# Patient Record
Sex: Female | Born: 1965 | Race: White | Hispanic: No | Marital: Married
Health system: Southern US, Community
[De-identification: ages and names within clinical notes are randomized; demographics above are authoritative.]

---

## 2003-07-13 ENCOUNTER — Emergency Department (HOSPITAL_COMMUNITY): Admission: EM | Admit: 2003-07-13 | Discharge: 2003-07-13 | Payer: Self-pay | Admitting: Emergency Medicine

## 2004-08-20 IMAGING — CT CT PELVIS W/O CM
1 of 2 series · 15 of 32 positions shown, 19 images · non-contrast
Comparison: none

CLINICAL DATA: right flank pain; question stone
 CT OF THE ABDOMEN WITHOUT CONTRAST
 Multidetector helical study performed without IV and without oral contrast with the renal stone protocol.  There is moderate right hydronephrosis and there are mild right perinephric edematous changes.  There is a 2mm in size lower pole right renal calculus.  There is a tiny (1mm) upper pole left renal calculus.  There is no left hydronephrosis.  There is a small hiatal hernia.  The remainder of the CT of the abdomen is negative.  
 IMPRESSION
 2mm in size lower pole right renal calculus and moderate right hydronephrosis.
 Tiny (1mm) upper pole left renal calculus.  Please see CT pelvis report.  
 CT OF THE PELVIS WITHOUT CONTRAST
 There is a 1mm in size calculus seen in the region of the right ureterovesical junction producing the moderate right hydronephrosis.  There are several calcifications seen within the pelvis felt to represent phleboliths.  In addition there is a calcification noted on image sequence # 77 which I believe projects just posterior to the right ureter and most likely is within the right iliac artery.  It is however difficult on this unenhanced study to be certain that this is not an excentrically located right ureteral calculus.  There is no pelvic mass or adenopathy.
 Bilateral small renal calculi. 
 Moderate right hydronephrosis secondary to a 1mm in size stone located in the region of the right ureterovesical junction.  
 Small (2mm) calcification which most likely is associated with the right iliac artery but is in close proximity of the right ureter (please see above discussion).  
 Small hiatal hernia.

[Series 2: urogram 5.0 b30f · axial · 0.65mm/px · z∈[-444,-48]mm · 15 of 109 slices shown, 19 images]
[im 5/109  soft-tissue]
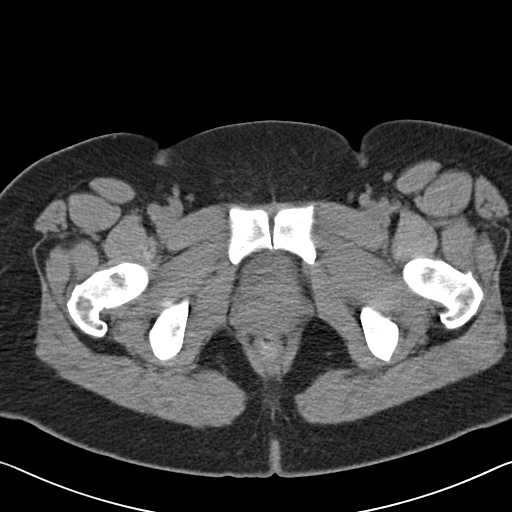
[im 5/109  bone]
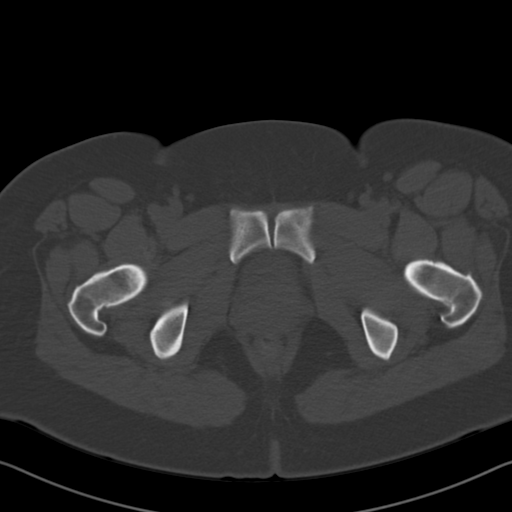
[im 14/109  soft-tissue]
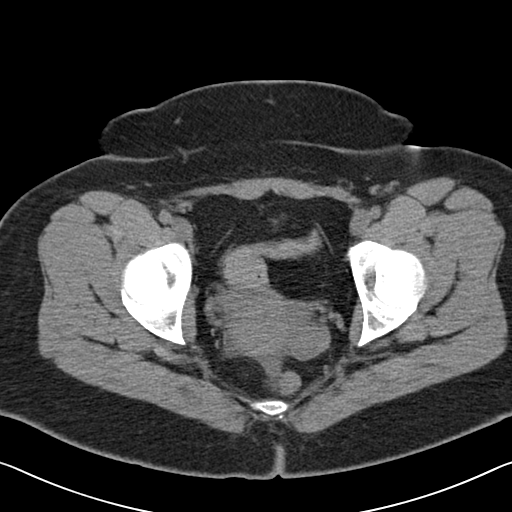
[im 23/109  soft-tissue]
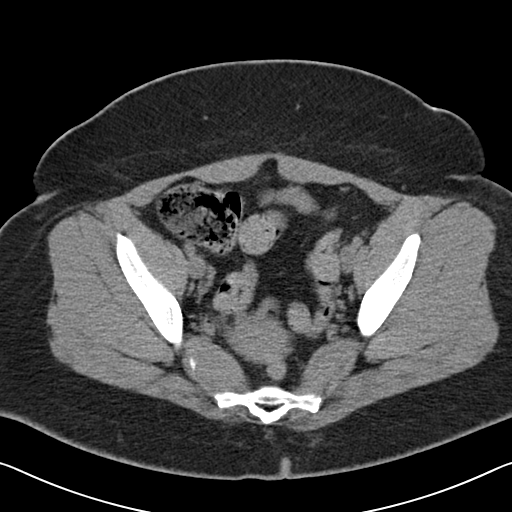
[im 32/109  soft-tissue]
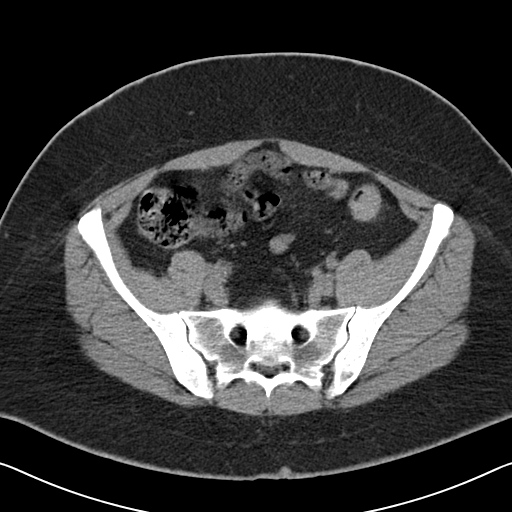
[im 37/109  soft-tissue]
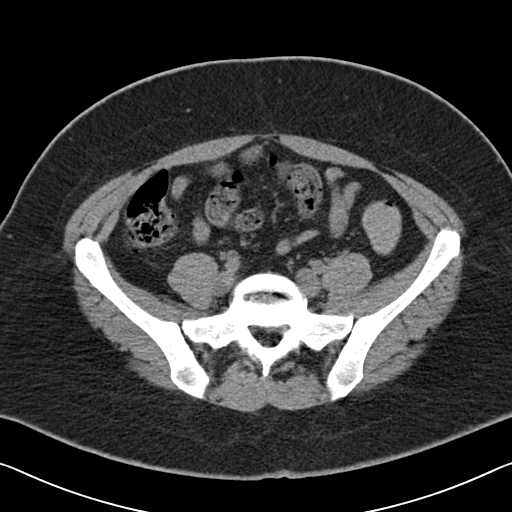
[im 46/109  soft-tissue]
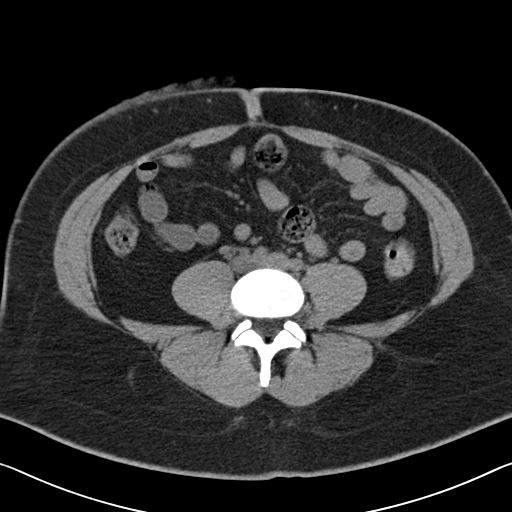
[im 55/109  soft-tissue]
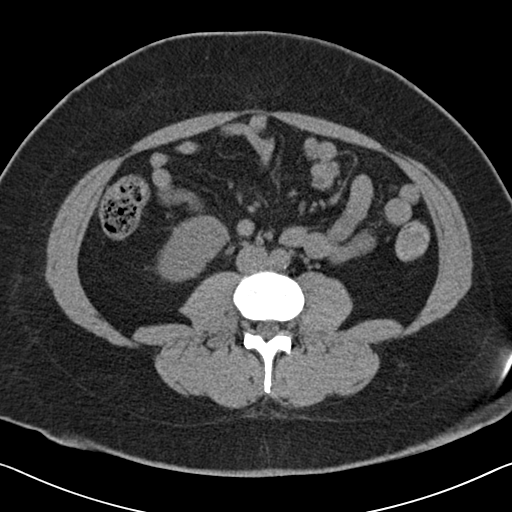
[im 64/109  soft-tissue]
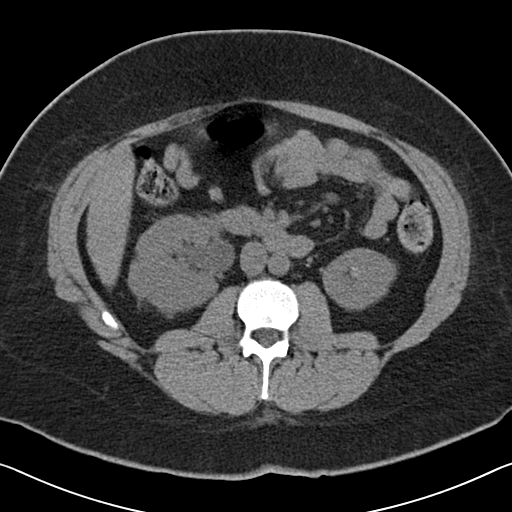
[im 73/109  soft-tissue]
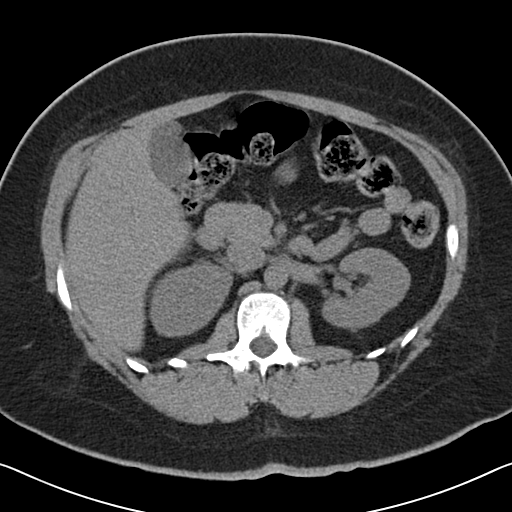
[im 73/109  bone]
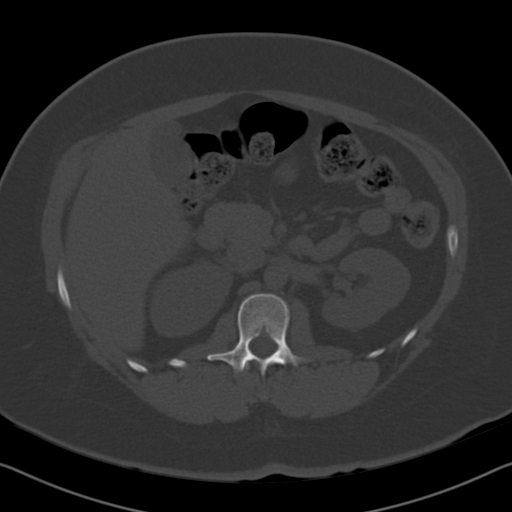
[im 77/109  soft-tissue]
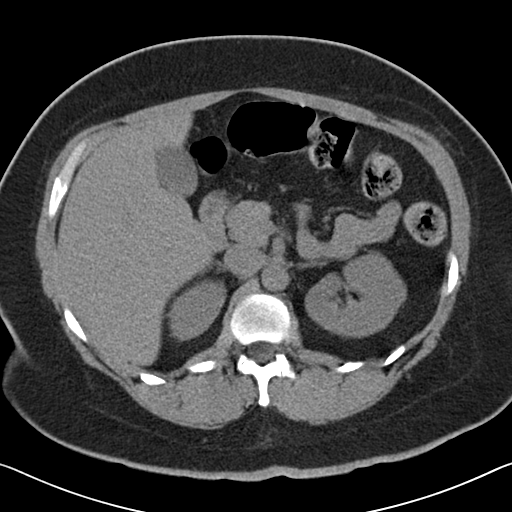
[im 86/109  soft-tissue]
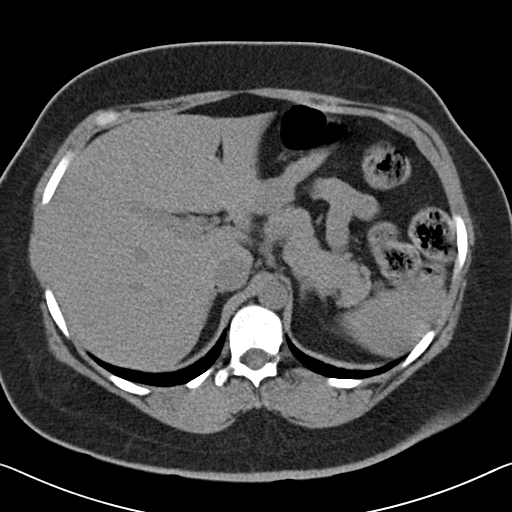
[im 91/109  lung]
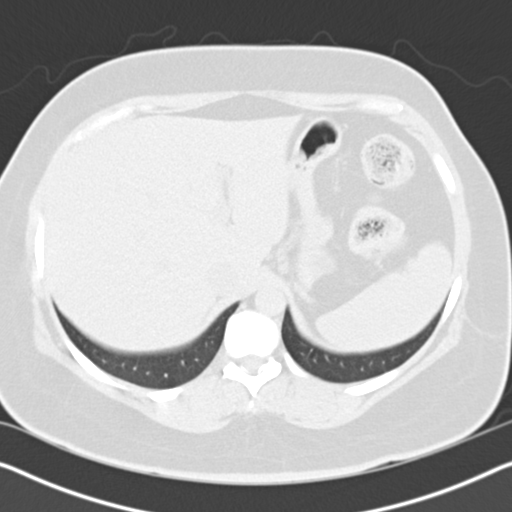
[im 95/109  soft-tissue]
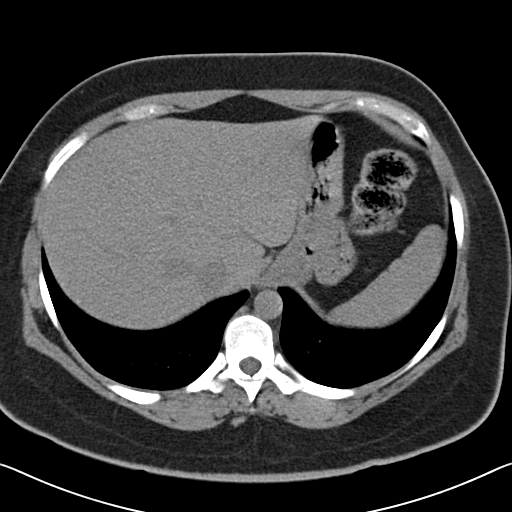
[im 95/109  lung]
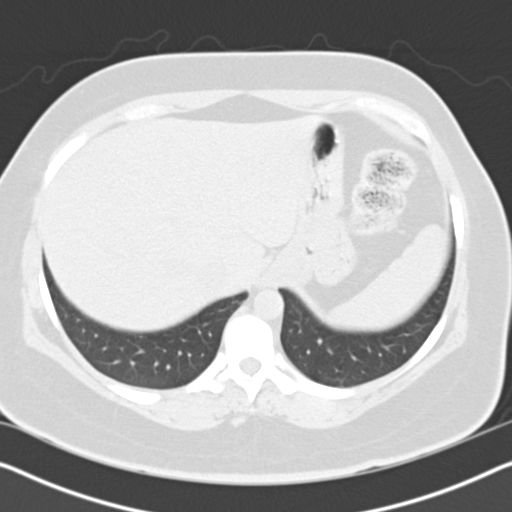
[im 100/109  lung]
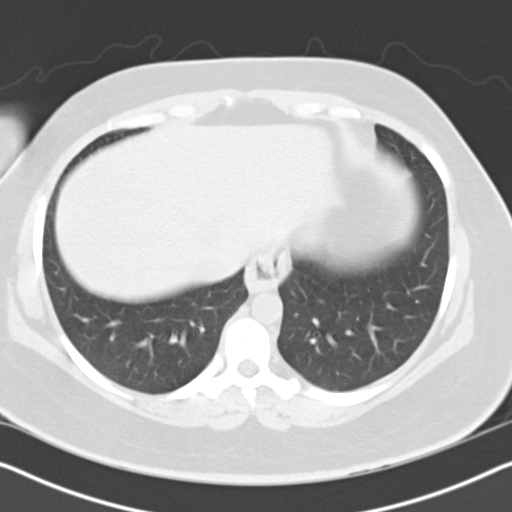
[im 104/109  soft-tissue]
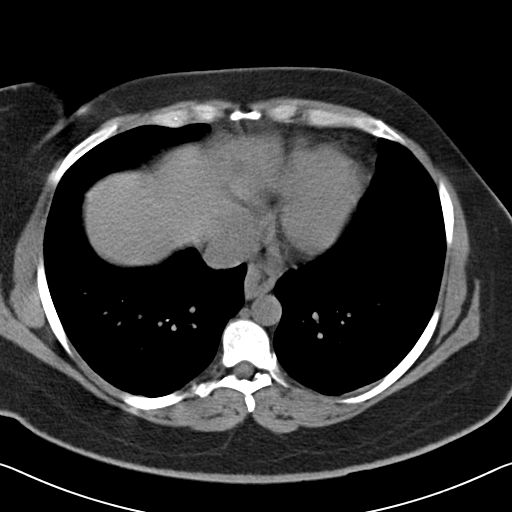
[im 104/109  lung]
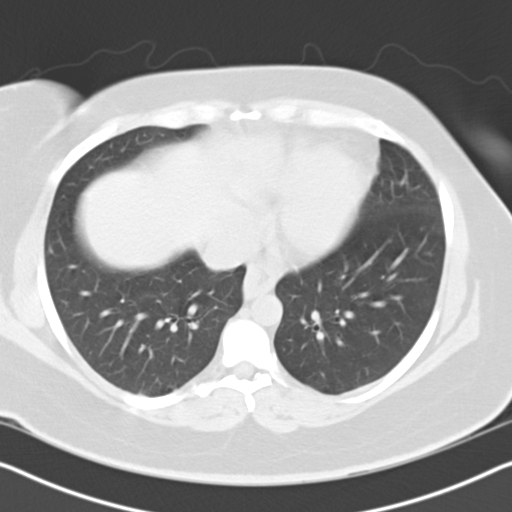

[15 of 32 positions shown; findings below may reference images not displayed]

## 2017-12-13 ENCOUNTER — Telehealth: Payer: Self-pay | Admitting: *Deleted

## 2017-12-13 NOTE — Telephone Encounter (Signed)
Critical Labs - ANC 0.5, Platelets 9, Hgb 8.5  MD notified.

## 2018-01-03 ENCOUNTER — Telehealth: Payer: Self-pay | Admitting: *Deleted

## 2018-01-03 NOTE — Telephone Encounter (Signed)
Error
